# Patient Record
Sex: Male | Born: 1965 | Race: White | Hispanic: No | Marital: Married | Smoking: Never smoker
Health system: Southern US, Community
[De-identification: ages and names within clinical notes are randomized; demographics above are authoritative.]

## PROBLEM LIST (undated history)

## (undated) DIAGNOSIS — M109 Gout, unspecified: Secondary | ICD-10-CM

---

## 2011-04-11 DIAGNOSIS — D229 Melanocytic nevi, unspecified: Secondary | ICD-10-CM

## 2011-04-11 HISTORY — DX: Melanocytic nevi, unspecified: D22.9

## 2013-12-23 ENCOUNTER — Ambulatory Visit (INDEPENDENT_AMBULATORY_CARE_PROVIDER_SITE_OTHER): Payer: BC Managed Care – PPO | Admitting: Nurse Practitioner

## 2013-12-23 ENCOUNTER — Telehealth: Payer: Self-pay | Admitting: Family Medicine

## 2013-12-23 ENCOUNTER — Encounter: Payer: Self-pay | Admitting: Nurse Practitioner

## 2013-12-23 ENCOUNTER — Encounter (INDEPENDENT_AMBULATORY_CARE_PROVIDER_SITE_OTHER): Payer: Self-pay

## 2013-12-23 VITALS — BP 139/90 | HR 56 | Temp 98.0°F | Ht 72.0 in | Wt 210.0 lb

## 2013-12-23 DIAGNOSIS — R5383 Other fatigue: Principal | ICD-10-CM

## 2013-12-23 DIAGNOSIS — T148 Other injury of unspecified body region: Secondary | ICD-10-CM

## 2013-12-23 DIAGNOSIS — W57XXXA Bitten or stung by nonvenomous insect and other nonvenomous arthropods, initial encounter: Secondary | ICD-10-CM

## 2013-12-23 DIAGNOSIS — R5381 Other malaise: Secondary | ICD-10-CM

## 2013-12-23 MED ORDER — DOXYCYCLINE HYCLATE 100 MG PO TABS: 100.0000 mg | ORAL_TABLET | Freq: Two times a day (BID) | ORAL | Status: DC

## 2013-12-23 NOTE — Telephone Encounter (Signed)
Pt has had recent tick bites appt scheduled

## 2013-12-23 NOTE — Progress Notes (Signed)
   Subjective:    Patient ID: Norman Howard, male    DOB: Oct 24, 1965, 48 y.o.   MRN: [id number]  HPI Patient in today c/o fatigue- he has had a lot of tick bites over the summer- Started feel ing fatigue for a couple of weeks- SAys he has find hisself laying around which is not normal for him.    Review of Systems  Constitutional: Positive for fatigue. Negative for fever.  HENT: Negative.   Respiratory: Negative.   Cardiovascular: Negative.   Genitourinary: Negative.   Neurological: Negative.   Hematological: Negative.   Psychiatric/Behavioral: Negative.   All other systems reviewed and are negative.      Objective:   Physical Exam  Constitutional: He is oriented to person, place, and time. He appears well-developed and well-nourished.  HENT:  Right Ear: External ear normal.  Left Ear: External ear normal.  Nose: Nose normal.  Mouth/Throat: Oropharynx is clear and moist.  Eyes: Conjunctivae are normal. Pupils are equal, round, and reactive to light.  Neck: Normal range of motion. Neck supple.  Cardiovascular: Normal rate, regular rhythm and normal heart sounds.   Pulmonary/Chest: Effort normal and breath sounds normal.  Abdominal: Soft. Bowel sounds are normal.  Lymphadenopathy:    He has no cervical adenopathy.  Neurological: He is alert and oriented to person, place, and time. He has normal reflexes. No cranial nerve deficit.  Skin: Skin is warm and dry.  Psychiatric: He has a normal mood and affect. His behavior is normal. Judgment and thought content normal.   BP 139/90  Pulse 56  Temp(Src) 98 F (36.7 C) (Oral)  Ht 6' (1.829 m)  Wt 210 lb (95.255 kg)  BMI 28.47 kg/m2        Assessment & Plan:   1. Other malaise and fatigue   2. Tick bite    Meds ordered this encounter  Medications  . doxycycline (VIBRA-TABS) 100 MG tablet    Sig: Take 1 tablet (100 mg total) by mouth 2 (two) times daily.    Dispense:  28 tablet    Refill:  0    Order Specific  Question:  Supervising Provider    Answer:  Chipper Herb [[id number]]   Orders Placed This Encounter  Procedures  . [place] spotted fvr abs pnl(IgG+IgM)  . Lyme Ab/Western Blot Reflex    Force fluids Rest  Labs pending- will call with results  Mary-Margaret Hassell Done, FNP

## 2013-12-23 NOTE — Patient Instructions (Signed)
Lyme Disease You may have been bitten by a tick and are to watch for the development of Lyme Disease. Lyme Disease is an infection that is caused by a bacteria The bacteria causing this disease is named Borreilia burgdorferi. If a tick is infected with this bacteria and then bites you, then Lyme Disease may occur. These ticks are carried by deer and rodents such as rabbits and mice and infest grassy as well as forested areas. Fortunately most tick bites do not cause Lyme Disease.  Lyme Disease is easier to prevent than to treat. First, covering your legs with clothing when walking in areas where ticks are possibly abundant will prevent their attachment because ticks tend to stay within inches of the ground. Second, using insecticides containing DEET can be applied on skin or clothing. Last, because it takes about 12 to 24 hours for the tick to transmit the disease after attachment to the human host, you should inspect your body for ticks twice a day when you are in areas where Lyme Disease is common. You must look thoroughly when searching for ticks. The Ixodes tick that carries Lyme Disease is very small. It is around the size of a sesame seed (picture of tick is not actual size). Removal is best done by grasping the tick by the head and pulling it out. Do not to squeeze the body of the tick. This could inject the infecting bacteria into the bite site. Wash the area of the bite with an antiseptic solution after removal.  Lyme Disease is a disease that may affect many body systems. Because of the small size of the biting tick, most people do not notice being bitten. The first sign of an infection is usually a round red rash that extends out from the center of the tick bite. The center of the lesion may be blood colored (hemorrhagic) or have tiny blisters (vesicular). Most lesions have bright red outer borders and partial central clearing. This rash may extend out many inches in diameter, and multiple lesions may  be present. Other symptoms such as fatigue, headaches, chills and fever, general achiness and swelling of lymph glands may also occur. If this first stage of the disease is left untreated, these symptoms may gradually resolve by themselves, or progressive symptoms may occur because of spread of infection to other areas of the body.  Follow up with your caregiver to have testing and treatment if you have a tick bite and you develop any of the above complaints. Your caregiver may recommend preventative (prophylactic) medications which kill bacteria (antibiotics). Once a diagnosis of Lyme Disease is made, antibiotic treatment is highly likely to cure the disease. Effective treatment of late stage Lyme Disease may require longer courses of antibiotic therapy.  MAKE SURE YOU:   Understand these instructions.  Will watch your condition.  Will get help right away if you are not doing well or get worse. Document Released: 08/01/2000 Document Revised: 07/18/2011 Document Reviewed: 10/03/2008 [employer] Patient Information 2015 [employer]. This information is not intended to replace advice given to you by your health care provider. Make sure you discuss any questions you have with your health care provider.  

## 2013-12-25 LAB — LYME AB/WESTERN BLOT REFLEX
LYME DISEASE AB, QUANT, IGM: <0.8 index (ref 0.00–0.79)
Lyme IgG/IgM Ab: <0.91 {ISR} (ref 0.00–0.90)

## 2013-12-25 LAB — ROCKY MTN SPOTTED FVR ABS PNL(IGG+IGM)
RMSF IGG: POSITIVE — AB
RMSF IGM: 0.66 {index} (ref 0.00–0.89)

## 2013-12-25 LAB — RMSF, IGG, IFA: RMSF, IGG, IFA: 1:64 {titer} — ABNORMAL HIGH

## 2013-12-26 ENCOUNTER — Encounter: Payer: Self-pay | Admitting: Family Medicine

## 2014-08-04 ENCOUNTER — Encounter: Payer: Self-pay | Admitting: Family Medicine

## 2014-08-04 ENCOUNTER — Ambulatory Visit (INDEPENDENT_AMBULATORY_CARE_PROVIDER_SITE_OTHER): Payer: BLUE CROSS/BLUE SHIELD

## 2014-08-04 ENCOUNTER — Ambulatory Visit (INDEPENDENT_AMBULATORY_CARE_PROVIDER_SITE_OTHER): Payer: BLUE CROSS/BLUE SHIELD | Admitting: Family Medicine

## 2014-08-04 VITALS — BP 140/98 | HR 64 | Temp 98.5°F | Ht 72.0 in | Wt 217.0 lb

## 2014-08-04 DIAGNOSIS — M25572 Pain in left ankle and joints of left foot: Secondary | ICD-10-CM

## 2014-08-04 NOTE — Progress Notes (Signed)
   Subjective:    Patient ID: Norman Howard, male    DOB: 1965/12/21, 49 y.o.   MRN: [id number]  HPI 49 year old male who presents with left foot pain. He awoke 2 days ago with pain in the foot such that he was unable to walk and bear weight. There is no history of injury. The foot was warm and red to the touch. Area involved is mostly on the dorsum of the foot second third and fourth toes but some on the volar surface of the foot corresponding location. There is no history of gout  There are no active problems to display for this patient.  Outpatient Encounter Prescriptions as of 08/04/2014  Medication Sig  . [DISCONTINUED] doxycycline (VIBRA-TABS) 100 MG tablet Take 1 tablet (100 mg total) by mouth 2 (two) times daily.     Review of Systems  Musculoskeletal: Positive for gait problem.       Objective:   Physical Exam  Constitutional: He appears well-developed.  Musculoskeletal:  Sam confined to left foot. There is some increased 10 redness and tenderness on the dorsum and volar surface of the foot in the area involved     BP 140/98 mmHg  Pulse 64  Temp(Src) 98.5 F (36.9 C) (Oral)  Ht 6' (1.829 m)  Wt 217 lb (98.431 kg)  BMI 29.42 kg/m2      Assessment & Plan:  1. Pain in joint, ankle and foot, left X-ray shows no evidence of any bony erosions or acute injury plan will treat with ibuprofen 6-800 mg 3 times a day and activity as tolerated - Uric acid - DG Foot Complete Left; Future  Wardell Honour MD

## 2014-08-05 LAB — URIC ACID: URIC ACID: 6.8 mg/dL (ref 3.7–8.6)

## 2016-01-12 IMAGING — CR DG FOOT COMPLETE 3+V*L*
3 series · 3 of 3 positions shown · non-contrast
Comparison: None.

CLINICAL DATA: Pain over metatarsal regions.  No history of trauma

EXAM:
LEFT FOOT - COMPLETE 3+ VIEW

[view not recorded (1 of 3)]
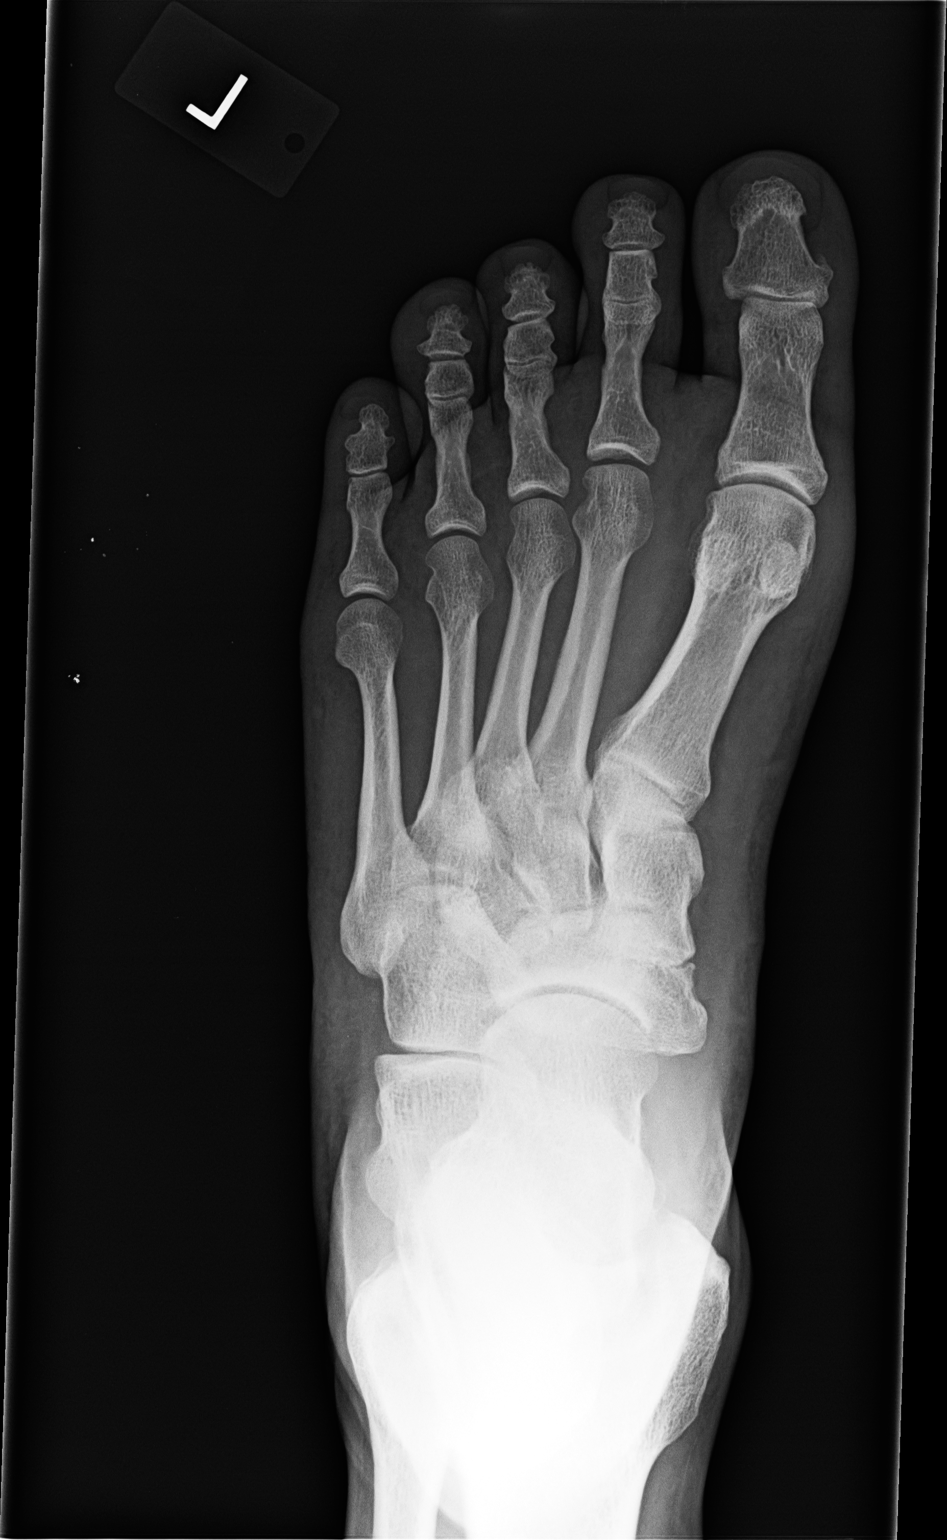

[view not recorded (2 of 3)]
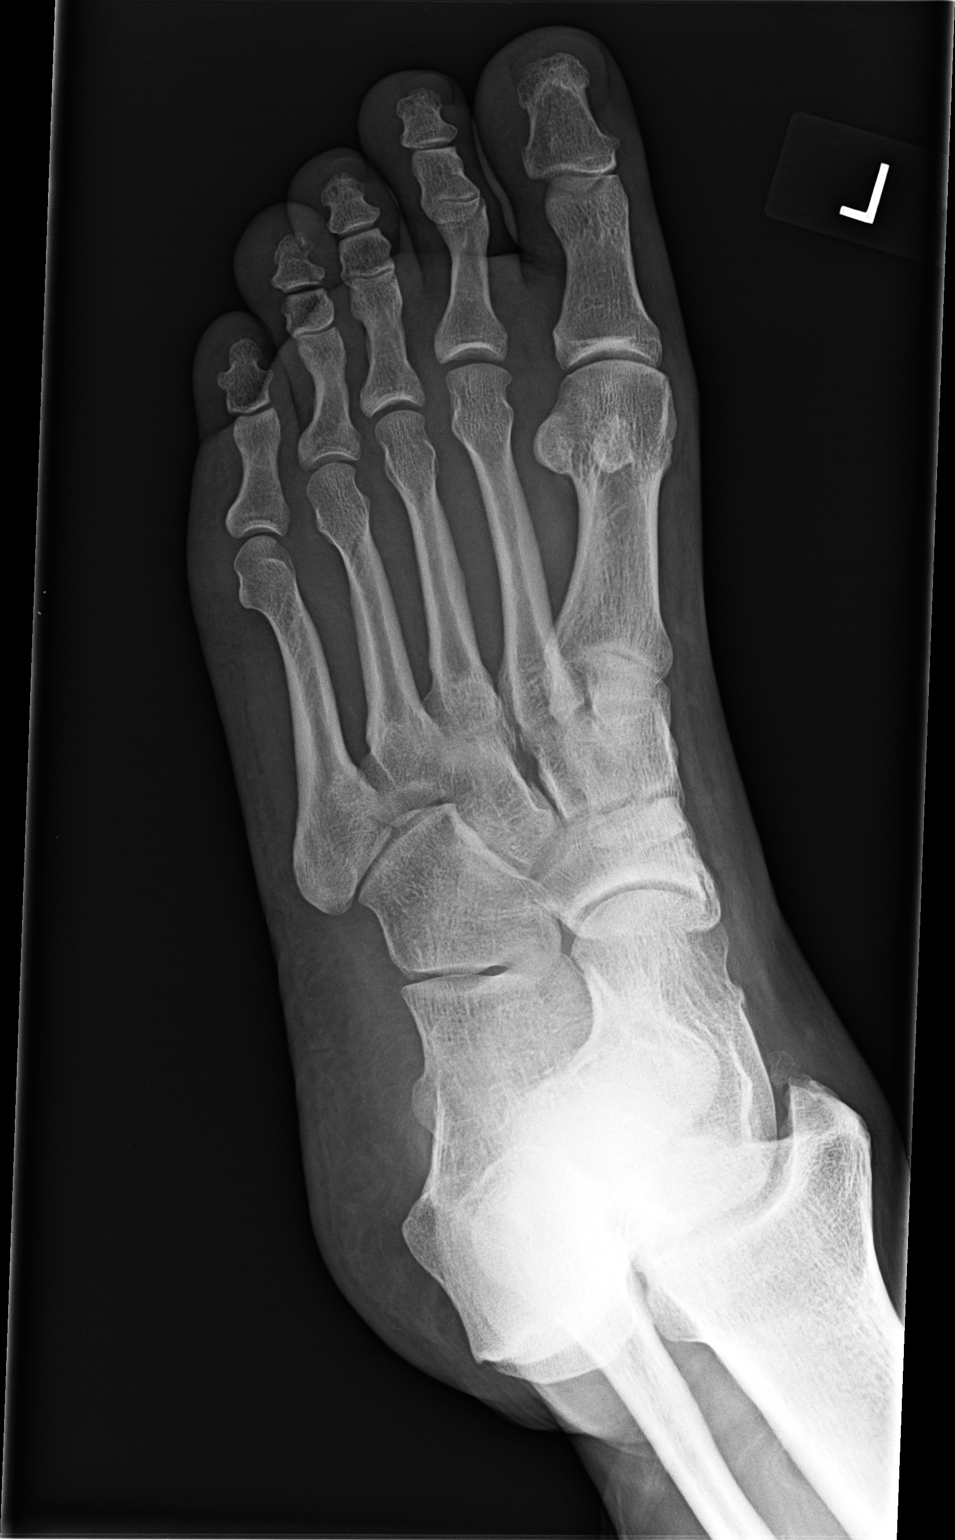

[view not recorded (3 of 3)]
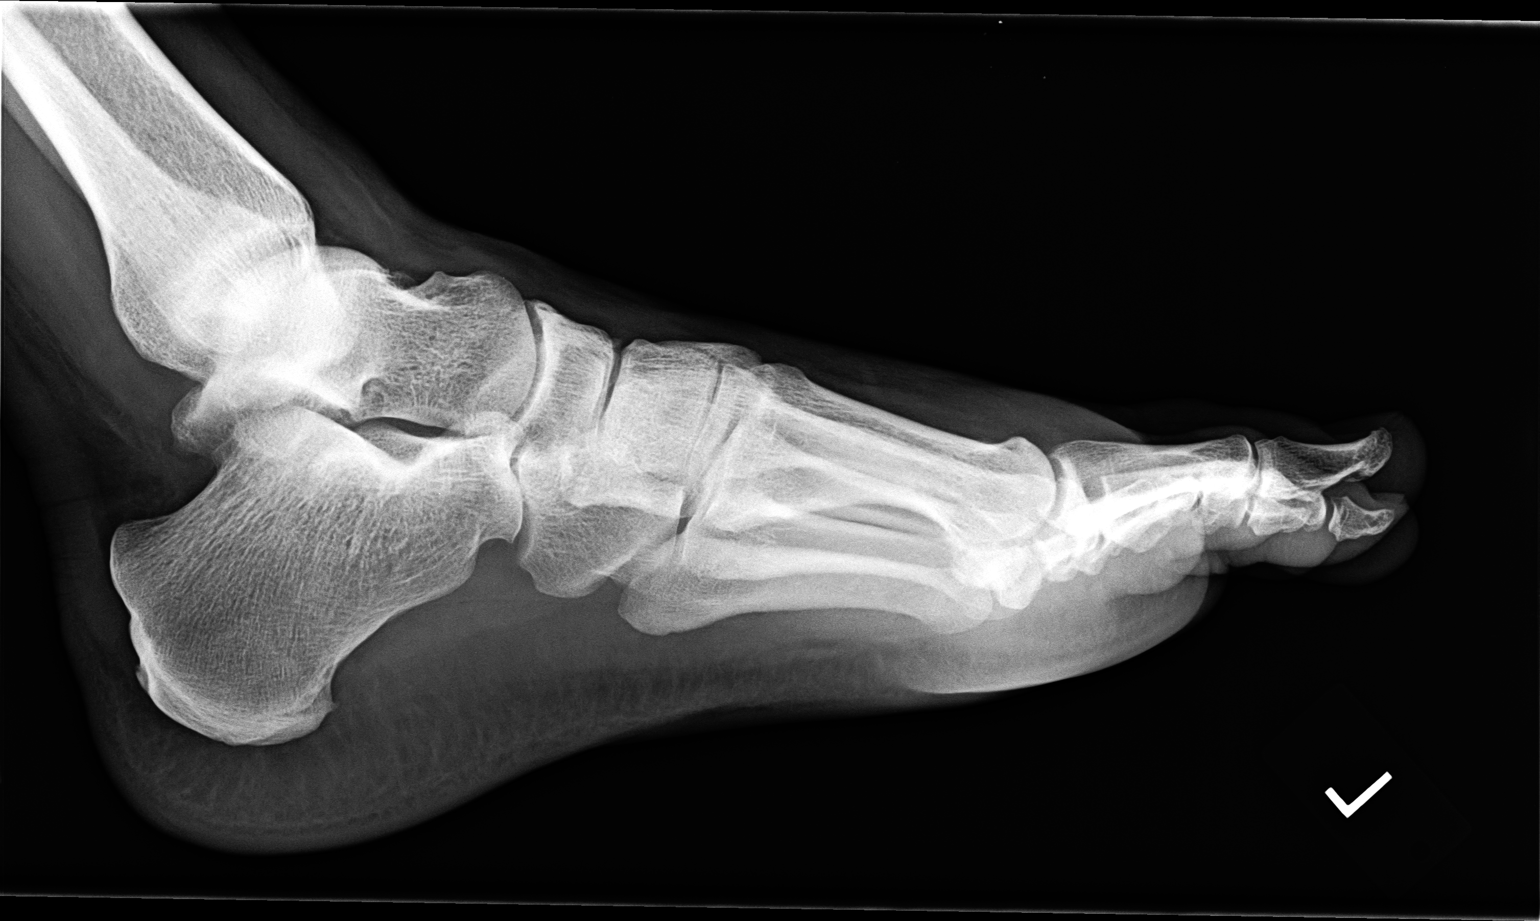

[3 of 3 positions shown; findings below may reference images not displayed]

FINDINGS: Frontal, oblique, and lateral views were obtained. There is soft
tissue swelling over the forefoot region. No fracture or
dislocation. Joint spaces appear intact. No erosive change.
IMPRESSION: Soft tissue swelling over the forefoot region. No appreciable
arthropathy. No fracture or dislocation.

## 2019-12-04 ENCOUNTER — Encounter: Payer: Self-pay | Admitting: Dermatology

## 2019-12-10 ENCOUNTER — Other Ambulatory Visit: Payer: Self-pay

## 2019-12-10 ENCOUNTER — Encounter: Payer: Self-pay | Admitting: Dermatology

## 2019-12-10 ENCOUNTER — Ambulatory Visit (INDEPENDENT_AMBULATORY_CARE_PROVIDER_SITE_OTHER): Payer: 59 | Admitting: Dermatology

## 2019-12-10 DIAGNOSIS — L980 Pyogenic granuloma: Secondary | ICD-10-CM

## 2019-12-10 DIAGNOSIS — D225 Melanocytic nevi of trunk: Secondary | ICD-10-CM | POA: Diagnosis not present

## 2019-12-10 DIAGNOSIS — D229 Melanocytic nevi, unspecified: Secondary | ICD-10-CM

## 2019-12-10 DIAGNOSIS — L570 Actinic keratosis: Secondary | ICD-10-CM

## 2019-12-10 DIAGNOSIS — Z1283 Encounter for screening for malignant neoplasm of skin: Secondary | ICD-10-CM | POA: Diagnosis not present

## 2020-01-19 ENCOUNTER — Encounter: Payer: Self-pay | Admitting: Dermatology

## 2020-01-19 NOTE — Progress Notes (Signed)
   New Patient   Subjective  Norman Howard is a 54 y.o. male who presents for the following: Skin Problem (check spot on lip- seems to be getting better now, left cheek-scaly x months).  Growth Location: Lower lip Duration: Months Quality:  Associated Signs/Symptoms: Modifying Factors:  Severity:  Timing: Context:    The following portions of the chart were reviewed this encounter and updated as appropriate:     Objective  Well appearing patient in no apparent distress; mood and affect are within normal limits.  All skin waist up examined.   Assessment & Plan  AK (actinic keratosis) Left Zygomatic Area  Destruction of lesion - Left Zygomatic Area Complexity: simple   Destruction method: cryotherapy   Informed consent: discussed and consent obtained   Timeout:  patient name, date of birth, surgical site, and procedure verified Lesion destroyed using liquid nitrogen: Yes   Region frozen until ice ball extended beyond lesion: Yes   Cryotherapy cycles:  5 Outcome: patient tolerated procedure well with no complications    Pyogenic granuloma Mid Lower [place] Lip  Patient comfortable with leaving lesion unless it grows or bleeds.  Nevus Mid Back  Annual skin examination Skin cancer screening performed today.

## 2021-08-07 ENCOUNTER — Encounter: Payer: Self-pay | Admitting: Emergency Medicine

## 2021-08-07 ENCOUNTER — Ambulatory Visit
Admission: EM | Admit: 2021-08-07 | Discharge: 2021-08-07 | Disposition: A | Discharge status: Home / Self Care | Payer: 59 | Attending: Urgent Care | Admitting: Urgent Care

## 2021-08-07 DIAGNOSIS — M109 Gout, unspecified: Secondary | ICD-10-CM | POA: Diagnosis not present

## 2021-08-07 DIAGNOSIS — M79675 Pain in left toe(s): Secondary | ICD-10-CM | POA: Diagnosis not present

## 2021-08-07 HISTORY — DX: Gout, unspecified: M10.9

## 2021-08-07 MED ORDER — PREDNISONE 20 MG PO TABS: ORAL_TABLET | ORAL | 0 refills | Status: AC

## 2021-08-07 NOTE — ED Provider Notes (Signed)
?  [facility] ? ? ?MRN: [id number] DOB: 05/25/1965 ? ?Subjective:  ? ?Norman Howard is a 56 y.o. male presenting for 4-day history of acute onset recurrent left great toe pain with swelling and redness.  Patient has a history of gout.  Has had about 3-4 episodes yearly.  Has not followed up with his PCP.  No fall, trauma.  Does not necessarily practice a low purine diet.  He does drink alcohol as well. ? ?No current facility-administered medications for this encounter. ?No current outpatient medications on file.  ? ?No Known Allergies ? ?Past Medical History:  ?Diagnosis Date  ? Atypical mole 04/11/2011  ? right outer abdomenm (mild)  ?  ? ?No past surgical history on file. ? ?Family History  ?Problem Relation Age of Onset  ? Cancer Father   ?     colon  ? ? ?Social History  ? ?Tobacco Use  ? Smoking status: Never  ? Smokeless tobacco: Never  ?Substance Use Topics  ? Alcohol use: Yes  ?  Comment: occ  ? Drug use: No  ? ? ?ROS ? ? ?Objective:  ? ?Vitals: ?BP 111/75 (BP Location: Right Arm)   Pulse 73   Temp 98.5 ?F (36.9 ?C) (Oral)   Resp 18   SpO2 97%  ? ?Physical Exam ?Constitutional:   ?   General: He is not in acute distress. ?   Appearance: Normal appearance. He is well-developed and normal weight. He is not ill-appearing, toxic-appearing or diaphoretic.  ?HENT:  ?   Head: Normocephalic and atraumatic.  ?   Right Ear: External ear normal.  ?   Left Ear: External ear normal.  ?   Nose: Nose normal.  ?   Mouth/Throat:  ?   Pharynx: Oropharynx is clear.  ?Eyes:  ?   General: No scleral icterus.    ?   Right eye: No discharge.     ?   Left eye: No discharge.  ?   Extraocular Movements: Extraocular movements intact.  ?Cardiovascular:  ?   Rate and Rhythm: Normal rate.  ?Pulmonary:  ?   Effort: Pulmonary effort is normal.  ?Musculoskeletal:  ?   Cervical back: Normal range of motion.  ?   Left foot: Normal range of motion and normal capillary refill. Swelling, tenderness and bony tenderness present.  No deformity, laceration or crepitus.  ?     Feet: ? ?Neurological:  ?   Mental Status: He is alert and oriented to person, place, and time.  ?Psychiatric:     ?   Mood and Affect: Mood normal.     ?   Behavior: Behavior normal.     ?   Thought Content: Thought content normal.     ?   Judgment: Judgment normal.  ? ? ?Assessment and Plan :  ? ?PDMP not reviewed this encounter. ? ?1. Acute gout involving toe of left foot, unspecified cause   ?2. Great toe pain, left   ? ? ?No history of CKD that is known to the patient.  Recommended very close follow-up with his PCP for further work-up.  In the meantime, we will use a prednisone course to help with his gout flare.  Emphasized need for a low purine diet. Counseled patient on potential for adverse effects with medications prescribed/recommended today, ER and return-to-clinic precautions discussed, patient verbalized understanding. ? ?  ?Jaynee Eagles, PA-C ?08/07/21 [id number] ? ?

## 2021-08-07 NOTE — ED Triage Notes (Signed)
Thinks he has gout in his great left toe.  Pain x 3 to 4 days.  Hx of gout ?

## 2022-07-25 DIAGNOSIS — S335XXA Sprain of ligaments of lumbar spine, initial encounter: Secondary | ICD-10-CM | POA: Diagnosis not present

## 2022-07-25 DIAGNOSIS — M546 Pain in thoracic spine: Secondary | ICD-10-CM | POA: Diagnosis not present

## 2022-07-25 DIAGNOSIS — S134XXA Sprain of ligaments of cervical spine, initial encounter: Secondary | ICD-10-CM | POA: Diagnosis not present

## 2022-07-25 DIAGNOSIS — M531 Cervicobrachial syndrome: Secondary | ICD-10-CM | POA: Diagnosis not present

## 2022-07-27 DIAGNOSIS — S134XXA Sprain of ligaments of cervical spine, initial encounter: Secondary | ICD-10-CM | POA: Diagnosis not present

## 2022-07-27 DIAGNOSIS — M546 Pain in thoracic spine: Secondary | ICD-10-CM | POA: Diagnosis not present

## 2022-07-27 DIAGNOSIS — S335XXA Sprain of ligaments of lumbar spine, initial encounter: Secondary | ICD-10-CM | POA: Diagnosis not present

## 2022-07-27 DIAGNOSIS — M531 Cervicobrachial syndrome: Secondary | ICD-10-CM | POA: Diagnosis not present

## 2022-08-02 DIAGNOSIS — M531 Cervicobrachial syndrome: Secondary | ICD-10-CM | POA: Diagnosis not present

## 2022-08-02 DIAGNOSIS — S335XXA Sprain of ligaments of lumbar spine, initial encounter: Secondary | ICD-10-CM | POA: Diagnosis not present

## 2022-08-02 DIAGNOSIS — M546 Pain in thoracic spine: Secondary | ICD-10-CM | POA: Diagnosis not present

## 2022-08-02 DIAGNOSIS — S134XXA Sprain of ligaments of cervical spine, initial encounter: Secondary | ICD-10-CM | POA: Diagnosis not present

## 2022-08-09 DIAGNOSIS — S134XXA Sprain of ligaments of cervical spine, initial encounter: Secondary | ICD-10-CM | POA: Diagnosis not present

## 2022-08-09 DIAGNOSIS — M546 Pain in thoracic spine: Secondary | ICD-10-CM | POA: Diagnosis not present

## 2022-08-09 DIAGNOSIS — S335XXA Sprain of ligaments of lumbar spine, initial encounter: Secondary | ICD-10-CM | POA: Diagnosis not present

## 2022-08-09 DIAGNOSIS — M531 Cervicobrachial syndrome: Secondary | ICD-10-CM | POA: Diagnosis not present

## 2022-08-15 DIAGNOSIS — M546 Pain in thoracic spine: Secondary | ICD-10-CM | POA: Diagnosis not present

## 2022-08-15 DIAGNOSIS — M531 Cervicobrachial syndrome: Secondary | ICD-10-CM | POA: Diagnosis not present

## 2022-08-15 DIAGNOSIS — S335XXA Sprain of ligaments of lumbar spine, initial encounter: Secondary | ICD-10-CM | POA: Diagnosis not present

## 2022-08-15 DIAGNOSIS — S134XXA Sprain of ligaments of cervical spine, initial encounter: Secondary | ICD-10-CM | POA: Diagnosis not present

## 2022-08-18 DIAGNOSIS — M546 Pain in thoracic spine: Secondary | ICD-10-CM | POA: Diagnosis not present

## 2022-08-18 DIAGNOSIS — M531 Cervicobrachial syndrome: Secondary | ICD-10-CM | POA: Diagnosis not present

## 2022-08-18 DIAGNOSIS — S134XXA Sprain of ligaments of cervical spine, initial encounter: Secondary | ICD-10-CM | POA: Diagnosis not present

## 2022-08-18 DIAGNOSIS — S335XXA Sprain of ligaments of lumbar spine, initial encounter: Secondary | ICD-10-CM | POA: Diagnosis not present

## 2022-08-23 DIAGNOSIS — S335XXA Sprain of ligaments of lumbar spine, initial encounter: Secondary | ICD-10-CM | POA: Diagnosis not present

## 2022-08-23 DIAGNOSIS — M531 Cervicobrachial syndrome: Secondary | ICD-10-CM | POA: Diagnosis not present

## 2022-08-23 DIAGNOSIS — M546 Pain in thoracic spine: Secondary | ICD-10-CM | POA: Diagnosis not present

## 2022-08-23 DIAGNOSIS — S134XXA Sprain of ligaments of cervical spine, initial encounter: Secondary | ICD-10-CM | POA: Diagnosis not present

## 2022-09-01 DIAGNOSIS — M546 Pain in thoracic spine: Secondary | ICD-10-CM | POA: Diagnosis not present

## 2022-09-01 DIAGNOSIS — M531 Cervicobrachial syndrome: Secondary | ICD-10-CM | POA: Diagnosis not present

## 2022-09-01 DIAGNOSIS — S335XXA Sprain of ligaments of lumbar spine, initial encounter: Secondary | ICD-10-CM | POA: Diagnosis not present

## 2022-09-01 DIAGNOSIS — S134XXA Sprain of ligaments of cervical spine, initial encounter: Secondary | ICD-10-CM | POA: Diagnosis not present

## 2022-09-08 DIAGNOSIS — S335XXA Sprain of ligaments of lumbar spine, initial encounter: Secondary | ICD-10-CM | POA: Diagnosis not present

## 2022-09-08 DIAGNOSIS — S134XXA Sprain of ligaments of cervical spine, initial encounter: Secondary | ICD-10-CM | POA: Diagnosis not present

## 2022-09-08 DIAGNOSIS — M546 Pain in thoracic spine: Secondary | ICD-10-CM | POA: Diagnosis not present

## 2022-09-08 DIAGNOSIS — M531 Cervicobrachial syndrome: Secondary | ICD-10-CM | POA: Diagnosis not present

## 2022-09-22 DIAGNOSIS — M531 Cervicobrachial syndrome: Secondary | ICD-10-CM | POA: Diagnosis not present

## 2022-09-22 DIAGNOSIS — S134XXA Sprain of ligaments of cervical spine, initial encounter: Secondary | ICD-10-CM | POA: Diagnosis not present

## 2022-09-22 DIAGNOSIS — M546 Pain in thoracic spine: Secondary | ICD-10-CM | POA: Diagnosis not present

## 2022-09-22 DIAGNOSIS — S335XXA Sprain of ligaments of lumbar spine, initial encounter: Secondary | ICD-10-CM | POA: Diagnosis not present

## 2022-10-06 DIAGNOSIS — M531 Cervicobrachial syndrome: Secondary | ICD-10-CM | POA: Diagnosis not present

## 2022-10-06 DIAGNOSIS — S335XXA Sprain of ligaments of lumbar spine, initial encounter: Secondary | ICD-10-CM | POA: Diagnosis not present

## 2022-10-06 DIAGNOSIS — S134XXA Sprain of ligaments of cervical spine, initial encounter: Secondary | ICD-10-CM | POA: Diagnosis not present

## 2022-10-06 DIAGNOSIS — M546 Pain in thoracic spine: Secondary | ICD-10-CM | POA: Diagnosis not present

## 2022-10-14 DIAGNOSIS — Z0000 Encounter for general adult medical examination without abnormal findings: Secondary | ICD-10-CM | POA: Diagnosis not present

## 2022-10-14 DIAGNOSIS — Z1331 Encounter for screening for depression: Secondary | ICD-10-CM | POA: Diagnosis not present

## 2022-10-14 DIAGNOSIS — E559 Vitamin D deficiency, unspecified: Secondary | ICD-10-CM | POA: Diagnosis not present

## 2022-10-14 DIAGNOSIS — M109 Gout, unspecified: Secondary | ICD-10-CM | POA: Diagnosis not present

## 2022-10-14 DIAGNOSIS — Z125 Encounter for screening for malignant neoplasm of prostate: Secondary | ICD-10-CM | POA: Diagnosis not present

## 2022-10-14 DIAGNOSIS — Z299 Encounter for prophylactic measures, unspecified: Secondary | ICD-10-CM | POA: Diagnosis not present

## 2022-10-19 ENCOUNTER — Encounter: Payer: Self-pay | Admitting: *Deleted

## 2022-10-27 DIAGNOSIS — S335XXA Sprain of ligaments of lumbar spine, initial encounter: Secondary | ICD-10-CM | POA: Diagnosis not present

## 2022-10-27 DIAGNOSIS — S134XXA Sprain of ligaments of cervical spine, initial encounter: Secondary | ICD-10-CM | POA: Diagnosis not present

## 2022-10-27 DIAGNOSIS — M531 Cervicobrachial syndrome: Secondary | ICD-10-CM | POA: Diagnosis not present

## 2022-10-27 DIAGNOSIS — M546 Pain in thoracic spine: Secondary | ICD-10-CM | POA: Diagnosis not present

## 2022-11-14 DIAGNOSIS — M531 Cervicobrachial syndrome: Secondary | ICD-10-CM | POA: Diagnosis not present

## 2022-11-14 DIAGNOSIS — S134XXA Sprain of ligaments of cervical spine, initial encounter: Secondary | ICD-10-CM | POA: Diagnosis not present

## 2022-11-14 DIAGNOSIS — S335XXA Sprain of ligaments of lumbar spine, initial encounter: Secondary | ICD-10-CM | POA: Diagnosis not present

## 2022-11-14 DIAGNOSIS — M546 Pain in thoracic spine: Secondary | ICD-10-CM | POA: Diagnosis not present

## 2022-11-28 DIAGNOSIS — S134XXA Sprain of ligaments of cervical spine, initial encounter: Secondary | ICD-10-CM | POA: Diagnosis not present

## 2022-11-28 DIAGNOSIS — M531 Cervicobrachial syndrome: Secondary | ICD-10-CM | POA: Diagnosis not present

## 2022-11-28 DIAGNOSIS — S335XXA Sprain of ligaments of lumbar spine, initial encounter: Secondary | ICD-10-CM | POA: Diagnosis not present

## 2022-11-28 DIAGNOSIS — M546 Pain in thoracic spine: Secondary | ICD-10-CM | POA: Diagnosis not present

## 2022-12-15 DIAGNOSIS — S134XXA Sprain of ligaments of cervical spine, initial encounter: Secondary | ICD-10-CM | POA: Diagnosis not present

## 2022-12-15 DIAGNOSIS — M531 Cervicobrachial syndrome: Secondary | ICD-10-CM | POA: Diagnosis not present

## 2022-12-15 DIAGNOSIS — M546 Pain in thoracic spine: Secondary | ICD-10-CM | POA: Diagnosis not present

## 2022-12-15 DIAGNOSIS — S335XXA Sprain of ligaments of lumbar spine, initial encounter: Secondary | ICD-10-CM | POA: Diagnosis not present

## 2022-12-29 DIAGNOSIS — S335XXA Sprain of ligaments of lumbar spine, initial encounter: Secondary | ICD-10-CM | POA: Diagnosis not present

## 2022-12-29 DIAGNOSIS — M531 Cervicobrachial syndrome: Secondary | ICD-10-CM | POA: Diagnosis not present

## 2022-12-29 DIAGNOSIS — M546 Pain in thoracic spine: Secondary | ICD-10-CM | POA: Diagnosis not present

## 2022-12-29 DIAGNOSIS — S134XXA Sprain of ligaments of cervical spine, initial encounter: Secondary | ICD-10-CM | POA: Diagnosis not present

## 2023-01-03 DIAGNOSIS — S335XXA Sprain of ligaments of lumbar spine, initial encounter: Secondary | ICD-10-CM | POA: Diagnosis not present

## 2023-01-03 DIAGNOSIS — M546 Pain in thoracic spine: Secondary | ICD-10-CM | POA: Diagnosis not present

## 2023-01-03 DIAGNOSIS — M531 Cervicobrachial syndrome: Secondary | ICD-10-CM | POA: Diagnosis not present

## 2023-01-03 DIAGNOSIS — S134XXA Sprain of ligaments of cervical spine, initial encounter: Secondary | ICD-10-CM | POA: Diagnosis not present

## 2023-01-17 DIAGNOSIS — M531 Cervicobrachial syndrome: Secondary | ICD-10-CM | POA: Diagnosis not present

## 2023-01-17 DIAGNOSIS — S335XXA Sprain of ligaments of lumbar spine, initial encounter: Secondary | ICD-10-CM | POA: Diagnosis not present

## 2023-01-17 DIAGNOSIS — S134XXA Sprain of ligaments of cervical spine, initial encounter: Secondary | ICD-10-CM | POA: Diagnosis not present

## 2023-01-17 DIAGNOSIS — M546 Pain in thoracic spine: Secondary | ICD-10-CM | POA: Diagnosis not present

## 2023-02-09 DIAGNOSIS — S134XXA Sprain of ligaments of cervical spine, initial encounter: Secondary | ICD-10-CM | POA: Diagnosis not present

## 2023-02-09 DIAGNOSIS — M546 Pain in thoracic spine: Secondary | ICD-10-CM | POA: Diagnosis not present

## 2023-02-09 DIAGNOSIS — S335XXA Sprain of ligaments of lumbar spine, initial encounter: Secondary | ICD-10-CM | POA: Diagnosis not present

## 2023-02-09 DIAGNOSIS — M531 Cervicobrachial syndrome: Secondary | ICD-10-CM | POA: Diagnosis not present

## 2023-02-23 DIAGNOSIS — M531 Cervicobrachial syndrome: Secondary | ICD-10-CM | POA: Diagnosis not present

## 2023-02-23 DIAGNOSIS — M546 Pain in thoracic spine: Secondary | ICD-10-CM | POA: Diagnosis not present

## 2023-02-23 DIAGNOSIS — S134XXA Sprain of ligaments of cervical spine, initial encounter: Secondary | ICD-10-CM | POA: Diagnosis not present

## 2023-02-23 DIAGNOSIS — S335XXA Sprain of ligaments of lumbar spine, initial encounter: Secondary | ICD-10-CM | POA: Diagnosis not present

## 2023-03-09 DIAGNOSIS — S134XXA Sprain of ligaments of cervical spine, initial encounter: Secondary | ICD-10-CM | POA: Diagnosis not present

## 2023-03-09 DIAGNOSIS — S335XXA Sprain of ligaments of lumbar spine, initial encounter: Secondary | ICD-10-CM | POA: Diagnosis not present

## 2023-03-09 DIAGNOSIS — M531 Cervicobrachial syndrome: Secondary | ICD-10-CM | POA: Diagnosis not present

## 2023-03-09 DIAGNOSIS — M546 Pain in thoracic spine: Secondary | ICD-10-CM | POA: Diagnosis not present

## 2023-04-04 DIAGNOSIS — M531 Cervicobrachial syndrome: Secondary | ICD-10-CM | POA: Diagnosis not present

## 2023-04-04 DIAGNOSIS — S134XXA Sprain of ligaments of cervical spine, initial encounter: Secondary | ICD-10-CM | POA: Diagnosis not present

## 2023-04-04 DIAGNOSIS — M546 Pain in thoracic spine: Secondary | ICD-10-CM | POA: Diagnosis not present

## 2023-04-04 DIAGNOSIS — S335XXA Sprain of ligaments of lumbar spine, initial encounter: Secondary | ICD-10-CM | POA: Diagnosis not present

## 2023-04-18 DIAGNOSIS — M546 Pain in thoracic spine: Secondary | ICD-10-CM | POA: Diagnosis not present

## 2023-04-18 DIAGNOSIS — S335XXA Sprain of ligaments of lumbar spine, initial encounter: Secondary | ICD-10-CM | POA: Diagnosis not present

## 2023-04-18 DIAGNOSIS — S134XXA Sprain of ligaments of cervical spine, initial encounter: Secondary | ICD-10-CM | POA: Diagnosis not present

## 2023-04-18 DIAGNOSIS — M531 Cervicobrachial syndrome: Secondary | ICD-10-CM | POA: Diagnosis not present

## 2023-04-21 ENCOUNTER — Encounter: Payer: Self-pay | Admitting: *Deleted

## 2023-05-16 DIAGNOSIS — M531 Cervicobrachial syndrome: Secondary | ICD-10-CM | POA: Diagnosis not present

## 2023-05-16 DIAGNOSIS — M546 Pain in thoracic spine: Secondary | ICD-10-CM | POA: Diagnosis not present

## 2023-05-16 DIAGNOSIS — S134XXA Sprain of ligaments of cervical spine, initial encounter: Secondary | ICD-10-CM | POA: Diagnosis not present

## 2023-05-16 DIAGNOSIS — S335XXA Sprain of ligaments of lumbar spine, initial encounter: Secondary | ICD-10-CM | POA: Diagnosis not present

## 2023-06-14 DIAGNOSIS — S134XXA Sprain of ligaments of cervical spine, initial encounter: Secondary | ICD-10-CM | POA: Diagnosis not present

## 2023-06-14 DIAGNOSIS — M531 Cervicobrachial syndrome: Secondary | ICD-10-CM | POA: Diagnosis not present

## 2023-06-14 DIAGNOSIS — M546 Pain in thoracic spine: Secondary | ICD-10-CM | POA: Diagnosis not present

## 2023-06-14 DIAGNOSIS — S335XXA Sprain of ligaments of lumbar spine, initial encounter: Secondary | ICD-10-CM | POA: Diagnosis not present

## 2023-07-12 DIAGNOSIS — M546 Pain in thoracic spine: Secondary | ICD-10-CM | POA: Diagnosis not present

## 2023-07-12 DIAGNOSIS — S134XXA Sprain of ligaments of cervical spine, initial encounter: Secondary | ICD-10-CM | POA: Diagnosis not present

## 2023-07-12 DIAGNOSIS — S335XXA Sprain of ligaments of lumbar spine, initial encounter: Secondary | ICD-10-CM | POA: Diagnosis not present

## 2023-07-12 DIAGNOSIS — M531 Cervicobrachial syndrome: Secondary | ICD-10-CM | POA: Diagnosis not present

## 2023-08-16 DIAGNOSIS — M531 Cervicobrachial syndrome: Secondary | ICD-10-CM | POA: Diagnosis not present

## 2023-08-16 DIAGNOSIS — S134XXA Sprain of ligaments of cervical spine, initial encounter: Secondary | ICD-10-CM | POA: Diagnosis not present

## 2023-08-16 DIAGNOSIS — S335XXA Sprain of ligaments of lumbar spine, initial encounter: Secondary | ICD-10-CM | POA: Diagnosis not present

## 2023-08-16 DIAGNOSIS — M546 Pain in thoracic spine: Secondary | ICD-10-CM | POA: Diagnosis not present

## 2023-09-13 DIAGNOSIS — S335XXA Sprain of ligaments of lumbar spine, initial encounter: Secondary | ICD-10-CM | POA: Diagnosis not present

## 2023-09-13 DIAGNOSIS — S134XXA Sprain of ligaments of cervical spine, initial encounter: Secondary | ICD-10-CM | POA: Diagnosis not present

## 2023-09-13 DIAGNOSIS — M546 Pain in thoracic spine: Secondary | ICD-10-CM | POA: Diagnosis not present

## 2023-09-13 DIAGNOSIS — M531 Cervicobrachial syndrome: Secondary | ICD-10-CM | POA: Diagnosis not present

## 2023-10-11 DIAGNOSIS — S134XXA Sprain of ligaments of cervical spine, initial encounter: Secondary | ICD-10-CM | POA: Diagnosis not present

## 2023-10-11 DIAGNOSIS — S335XXA Sprain of ligaments of lumbar spine, initial encounter: Secondary | ICD-10-CM | POA: Diagnosis not present

## 2023-10-11 DIAGNOSIS — M531 Cervicobrachial syndrome: Secondary | ICD-10-CM | POA: Diagnosis not present

## 2023-10-11 DIAGNOSIS — M546 Pain in thoracic spine: Secondary | ICD-10-CM | POA: Diagnosis not present

## 2023-11-08 DIAGNOSIS — S335XXA Sprain of ligaments of lumbar spine, initial encounter: Secondary | ICD-10-CM | POA: Diagnosis not present

## 2023-11-08 DIAGNOSIS — S134XXA Sprain of ligaments of cervical spine, initial encounter: Secondary | ICD-10-CM | POA: Diagnosis not present

## 2023-11-08 DIAGNOSIS — M531 Cervicobrachial syndrome: Secondary | ICD-10-CM | POA: Diagnosis not present

## 2023-11-08 DIAGNOSIS — M546 Pain in thoracic spine: Secondary | ICD-10-CM | POA: Diagnosis not present

## 2023-11-14 DIAGNOSIS — Z0000 Encounter for general adult medical examination without abnormal findings: Secondary | ICD-10-CM | POA: Diagnosis not present

## 2023-11-14 DIAGNOSIS — Z125 Encounter for screening for malignant neoplasm of prostate: Secondary | ICD-10-CM | POA: Diagnosis not present

## 2023-12-05 ENCOUNTER — Encounter (INDEPENDENT_AMBULATORY_CARE_PROVIDER_SITE_OTHER): Payer: Self-pay | Admitting: *Deleted

## 2023-12-28 DIAGNOSIS — S134XXA Sprain of ligaments of cervical spine, initial encounter: Secondary | ICD-10-CM | POA: Diagnosis not present

## 2023-12-28 DIAGNOSIS — S335XXA Sprain of ligaments of lumbar spine, initial encounter: Secondary | ICD-10-CM | POA: Diagnosis not present

## 2023-12-28 DIAGNOSIS — M546 Pain in thoracic spine: Secondary | ICD-10-CM | POA: Diagnosis not present

## 2023-12-28 DIAGNOSIS — M531 Cervicobrachial syndrome: Secondary | ICD-10-CM | POA: Diagnosis not present

## 2024-01-25 DIAGNOSIS — S335XXA Sprain of ligaments of lumbar spine, initial encounter: Secondary | ICD-10-CM | POA: Diagnosis not present

## 2024-01-25 DIAGNOSIS — S134XXA Sprain of ligaments of cervical spine, initial encounter: Secondary | ICD-10-CM | POA: Diagnosis not present

## 2024-01-25 DIAGNOSIS — M531 Cervicobrachial syndrome: Secondary | ICD-10-CM | POA: Diagnosis not present

## 2024-01-25 DIAGNOSIS — M546 Pain in thoracic spine: Secondary | ICD-10-CM | POA: Diagnosis not present

## 2024-02-21 DIAGNOSIS — M531 Cervicobrachial syndrome: Secondary | ICD-10-CM | POA: Diagnosis not present

## 2024-02-21 DIAGNOSIS — S134XXA Sprain of ligaments of cervical spine, initial encounter: Secondary | ICD-10-CM | POA: Diagnosis not present

## 2024-02-21 DIAGNOSIS — S335XXA Sprain of ligaments of lumbar spine, initial encounter: Secondary | ICD-10-CM | POA: Diagnosis not present

## 2024-02-21 DIAGNOSIS — M546 Pain in thoracic spine: Secondary | ICD-10-CM | POA: Diagnosis not present

## 2024-03-20 DIAGNOSIS — S335XXA Sprain of ligaments of lumbar spine, initial encounter: Secondary | ICD-10-CM | POA: Diagnosis not present

## 2024-03-20 DIAGNOSIS — M531 Cervicobrachial syndrome: Secondary | ICD-10-CM | POA: Diagnosis not present

## 2024-03-20 DIAGNOSIS — S134XXA Sprain of ligaments of cervical spine, initial encounter: Secondary | ICD-10-CM | POA: Diagnosis not present

## 2024-03-20 DIAGNOSIS — M546 Pain in thoracic spine: Secondary | ICD-10-CM | POA: Diagnosis not present

## 2024-04-08 DIAGNOSIS — S134XXA Sprain of ligaments of cervical spine, initial encounter: Secondary | ICD-10-CM | POA: Diagnosis not present

## 2024-04-08 DIAGNOSIS — S335XXA Sprain of ligaments of lumbar spine, initial encounter: Secondary | ICD-10-CM | POA: Diagnosis not present

## 2024-04-08 DIAGNOSIS — M546 Pain in thoracic spine: Secondary | ICD-10-CM | POA: Diagnosis not present

## 2024-04-08 DIAGNOSIS — M531 Cervicobrachial syndrome: Secondary | ICD-10-CM | POA: Diagnosis not present

## 2024-05-06 DIAGNOSIS — S134XXA Sprain of ligaments of cervical spine, initial encounter: Secondary | ICD-10-CM | POA: Diagnosis not present

## 2024-05-06 DIAGNOSIS — M546 Pain in thoracic spine: Secondary | ICD-10-CM | POA: Diagnosis not present

## 2024-05-06 DIAGNOSIS — S335XXA Sprain of ligaments of lumbar spine, initial encounter: Secondary | ICD-10-CM | POA: Diagnosis not present

## 2024-05-06 DIAGNOSIS — M531 Cervicobrachial syndrome: Secondary | ICD-10-CM | POA: Diagnosis not present
